# Patient Record
Sex: Female | Born: 1955 | Race: Black or African American | Hispanic: No | Marital: Single | Smoking: Former smoker
Health system: Southern US, Community
[De-identification: ages and names within clinical notes are randomized; demographics above are authoritative.]

## PROBLEM LIST (undated history)

## (undated) DIAGNOSIS — I739 Peripheral vascular disease, unspecified: Secondary | ICD-10-CM

## (undated) DIAGNOSIS — F0391 Unspecified dementia with behavioral disturbance: Secondary | ICD-10-CM

## (undated) DIAGNOSIS — E11319 Type 2 diabetes mellitus with unspecified diabetic retinopathy without macular edema: Secondary | ICD-10-CM

## (undated) DIAGNOSIS — K5900 Constipation, unspecified: Secondary | ICD-10-CM

## (undated) DIAGNOSIS — N182 Chronic kidney disease, stage 2 (mild): Secondary | ICD-10-CM

## (undated) DIAGNOSIS — N289 Disorder of kidney and ureter, unspecified: Secondary | ICD-10-CM

## (undated) DIAGNOSIS — I10 Essential (primary) hypertension: Secondary | ICD-10-CM

## (undated) DIAGNOSIS — E119 Type 2 diabetes mellitus without complications: Secondary | ICD-10-CM

## (undated) DIAGNOSIS — F03918 Unspecified dementia, unspecified severity, with other behavioral disturbance: Secondary | ICD-10-CM

## (undated) DIAGNOSIS — G119 Hereditary ataxia, unspecified: Secondary | ICD-10-CM

---

## 2013-09-23 ENCOUNTER — Inpatient Hospital Stay: Payer: Self-pay | Admitting: Internal Medicine

## 2013-09-23 LAB — CBC
HCT: 31.9 % — ABNORMAL LOW (ref 35.0–47.0)
HGB: 10.3 g/dL — ABNORMAL LOW (ref 12.0–16.0)
MCH: 27.1 pg (ref 26.0–34.0)
MCHC: 32.3 g/dL (ref 32.0–36.0)
MCV: 84 fL (ref 80–100)
PLATELETS: 436 10*3/uL (ref 150–440)
RBC: 3.8 10*6/uL (ref 3.80–5.20)
RDW: 13.9 % (ref 11.5–14.5)
WBC: 9.9 10*3/uL (ref 3.6–11.0)

## 2013-09-23 LAB — COMPREHENSIVE METABOLIC PANEL
ALT: 28 U/L (ref 12–78)
ANION GAP: 6 — AB (ref 7–16)
Albumin: 3.2 g/dL — ABNORMAL LOW (ref 3.4–5.0)
Alkaline Phosphatase: 168 U/L — ABNORMAL HIGH
BILIRUBIN TOTAL: 0.2 mg/dL (ref 0.2–1.0)
BUN: 27 mg/dL — ABNORMAL HIGH (ref 7–18)
CHLORIDE: 106 mmol/L (ref 98–107)
CO2: 28 mmol/L (ref 21–32)
Calcium, Total: 9.6 mg/dL (ref 8.5–10.1)
Creatinine: 0.87 mg/dL (ref 0.60–1.30)
Glucose: 93 mg/dL (ref 65–99)
OSMOLALITY: 284 (ref 275–301)
POTASSIUM: 4.2 mmol/L (ref 3.5–5.1)
SGOT(AST): 33 U/L (ref 15–37)
SODIUM: 140 mmol/L (ref 136–145)
Total Protein: 9 g/dL — ABNORMAL HIGH (ref 6.4–8.2)

## 2013-09-24 LAB — RAPID HIV-1/2 QL/CONFIRM: HIV-1/2,Rapid Ql: NEGATIVE

## 2013-09-24 LAB — BILIRUBIN, DIRECT

## 2013-09-25 LAB — CBC WITH DIFFERENTIAL/PLATELET
BASOS ABS: 0.1 10*3/uL (ref 0.0–0.1)
Basophil %: 0.7 %
EOS PCT: 0.2 %
Eosinophil #: 0 10*3/uL (ref 0.0–0.7)
HCT: 27.4 % — AB (ref 35.0–47.0)
HGB: 9 g/dL — ABNORMAL LOW (ref 12.0–16.0)
LYMPHS ABS: 2.7 10*3/uL (ref 1.0–3.6)
Lymphocyte %: 28.8 %
MCH: 27.3 pg (ref 26.0–34.0)
MCHC: 32.8 g/dL (ref 32.0–36.0)
MCV: 83 fL (ref 80–100)
MONO ABS: 0.5 x10 3/mm (ref 0.2–0.9)
Monocyte %: 5.6 %
NEUTROS ABS: 6 10*3/uL (ref 1.4–6.5)
Neutrophil %: 64.7 %
Platelet: 354 10*3/uL (ref 150–440)
RBC: 3.3 10*6/uL — AB (ref 3.80–5.20)
RDW: 13.6 % (ref 11.5–14.5)
WBC: 9.2 10*3/uL (ref 3.6–11.0)

## 2013-09-25 LAB — BASIC METABOLIC PANEL
ANION GAP: 6 — AB (ref 7–16)
BUN: 34 mg/dL — AB (ref 7–18)
CO2: 25 mmol/L (ref 21–32)
CREATININE: 1.14 mg/dL (ref 0.60–1.30)
Calcium, Total: 9.2 mg/dL (ref 8.5–10.1)
Chloride: 109 mmol/L — ABNORMAL HIGH (ref 98–107)
EGFR (African American): >60
GFR CALC NON AF AMER: 52 — AB
GLUCOSE: 224 mg/dL — AB (ref 65–99)
Osmolality: 294 (ref 275–301)
POTASSIUM: 4.1 mmol/L (ref 3.5–5.1)
Sodium: 140 mmol/L (ref 136–145)

## 2013-09-26 LAB — COMPREHENSIVE METABOLIC PANEL
ALT: 26 U/L (ref 12–78)
ANION GAP: 4 — AB (ref 7–16)
Albumin: 2.7 g/dL — ABNORMAL LOW (ref 3.4–5.0)
Alkaline Phosphatase: 150 U/L — ABNORMAL HIGH
BUN: 21 mg/dL — ABNORMAL HIGH (ref 7–18)
Bilirubin,Total: 0.2 mg/dL (ref 0.2–1.0)
CALCIUM: 9.6 mg/dL (ref 8.5–10.1)
CHLORIDE: 104 mmol/L (ref 98–107)
CREATININE: 0.86 mg/dL (ref 0.60–1.30)
Co2: 31 mmol/L (ref 21–32)
EGFR (Non-African Amer.): >60
GLUCOSE: 138 mg/dL — AB (ref 65–99)
OSMOLALITY: 283 (ref 275–301)
Potassium: 4.1 mmol/L (ref 3.5–5.1)
SGOT(AST): 32 U/L (ref 15–37)
Sodium: 139 mmol/L (ref 136–145)
Total Protein: 7.9 g/dL (ref 6.4–8.2)

## 2013-09-27 LAB — WOUND CULTURE

## 2013-09-28 LAB — CULTURE, BLOOD (SINGLE)

## 2013-09-28 LAB — WOUND CULTURE

## 2013-09-29 LAB — PATHOLOGY REPORT

## 2013-11-04 ENCOUNTER — Ambulatory Visit: Payer: Self-pay | Admitting: Podiatry

## 2013-11-04 LAB — CBC WITH DIFFERENTIAL/PLATELET
Basophil #: 0.1 10*3/uL (ref 0.0–0.1)
Basophil %: 0.8 %
EOS PCT: 1.8 %
Eosinophil #: 0.1 10*3/uL (ref 0.0–0.7)
HCT: 24.7 % — AB (ref 35.0–47.0)
HGB: 8.3 g/dL — AB (ref 12.0–16.0)
LYMPHS ABS: 2.6 10*3/uL (ref 1.0–3.6)
LYMPHS PCT: 34.5 %
MCH: 28 pg (ref 26.0–34.0)
MCHC: 33.5 g/dL (ref 32.0–36.0)
MCV: 84 fL (ref 80–100)
Monocyte #: 0.5 x10 3/mm (ref 0.2–0.9)
Monocyte %: 6.5 %
NEUTROS PCT: 56.4 %
Neutrophil #: 4.3 10*3/uL (ref 1.4–6.5)
PLATELETS: 333 10*3/uL (ref 150–440)
RBC: 2.96 10*6/uL — ABNORMAL LOW (ref 3.80–5.20)
RDW: 14.9 % — ABNORMAL HIGH (ref 11.5–14.5)
WBC: 7.6 10*3/uL (ref 3.6–11.0)

## 2013-11-05 ENCOUNTER — Ambulatory Visit: Payer: Self-pay | Admitting: Podiatry

## 2013-11-08 LAB — PATHOLOGY REPORT

## 2013-11-09 LAB — WOUND CULTURE

## 2014-09-17 NOTE — Consult Note (Signed)
PATIENT NAME:  Holly BrothersRESIDENT, Holly E MR#:  [id number] DATE OF BIRTH:  [birth year]  DATE OF CONSULTATION:  09/24/2013  CONSULTING PHYSICIAN:  Linus Galasodd Braylinn Gulden, DPM  REASON FOR CONSULTATION:  This is a 59 year old female recently admitted with some pain in her left great toe.  States she has had a sore that has been bleeding occasionally over the last 3 weeks.  Denies any specific injury.  Presented to the Emergency Department where she was diagnosed with osteomyelitis and admitted for IV antibiotics and treatment.   PAST MEDICAL HISTORY:  Diabetes, hypertension.   PAST SURGICAL HISTORY:  Appendectomy.   MEDICATIONS:  Metformin 1000 mg b.i.d., amlodipine 10 mg daily, pravastatin 40 mg daily.   ALLERGIES:  No known drug allergies.   FAMILY HISTORY:  Hypertension.   SOCIAL HISTORY:  She lives with her sister.  Does use occasional tobacco.  Denies any alcohol or drug use.   REVIEW OF SYSTEMS:  Denies any specific fever or weakness.  No hearing or vision changes. Some swelling in the left great toe but no current drainage.  Denies any numbness, paresthesias. No dysuria or incontinence.  No stomach pain, heartburn, or nausea.   PHYSICAL EXAMINATION: VASCULAR:  DP and PT pulses are palpable.  Capillary filling time within normal limits.  NEUROLOGICAL:  Protective threshold with monofilament wire is intact and symmetric. Proprioception is intact.  INTEGUMENT:  Skin is warm, dry, and supple.  Some mild diminished hair growth.  Some edema and erythema in the left hallux with some fluctuant but no active drainage around the IPJ.  MUSCULOSKELETAL:  Adequate range of motion.  Muscle testing is 5/5.   X-RAYS:  Multiple views of the left foot reveal cortical destruction at the base of the distal phalanx.  This is consistent with osteomyelitis.  Followup studies with MRI revealed evidence for osteomyelitis in the distal phalanx and the proximal phalanx.  Abscess is noted in the soft tissues dorsally.    IMPRESSION: 1.  Osteomyelitis, left great toe.  2.  Diabetes.   PLAN:  I discussed with the patient the need for amputation of the left great toe.  We discussed risks versus benefits.  Possible complications including not healing or continued infection requiring further amputation were also discussed.  Discussed this with her sister as well by phone.  The patient is n.p.o.  Obtained consent for amputation left great toe.  We will plan on surgery for later on this evening.   ____________________________ Linus Galasodd Ivelise Castillo, DPM tc:ja D: 09/24/2013 14:15:37 ET T: 09/24/2013 14:40:35 ET JOB#: [id number]  cc: Linus Galasodd Ching Rabideau, DPM, <Dictator> Dorrien Grunder DPM ELECTRONICALLY SIGNED 10/05/2013 15:59

## 2014-09-17 NOTE — Consult Note (Signed)
PATIENT NAME:  Holly Ford, Holly Ford MR#:  [id number] DATE OF BIRTH:  1956-02-11  DATE OF CONSULTATION:  09/24/2013  REFERRING PHYSICIAN:  Fritzi Mandes, MD CONSULTING PHYSICIAN:  Cheral Marker. Ola Spurr, MD  REASON FOR CONSULT: Left great toe osteomyelitis in a diabetic patient.   HISTORY OF PRESENT ILLNESS: This is a very pleasant 59 year old female with a history of hypertension and diabetes and hyperlipidemia. She is visiting her sister from [place] and has been up here about 3 weeks. She began developing pain and bleeding from her left great toe but denies any recent injury to it or having necrotic ulcer there. The pain and bleeding progressed, and she came to the Emergency Room where she was found to have evidence of an abscess and osteomyelitis on x-ray and MRI. She is going for surgery later today.   PAST MEDICAL HISTORY:  1. Diabetes.  2. Hypertension.  3. Hyperlipidemia.   PAST SURGICAL HISTORY: Appendectomy.   ALLERGIES: No known drug allergies.   OUTPATIENT MEDICATIONS: Metformin, amlodipine and pravastatin.   ANTIBIOTICS SINCE ADMISSION: Include Zosyn.   REVIEW OF SYSTEMS: Eleven systems are negative except for as per HPI. Of note, however, the patient says she has lost a lot of weight recently. She has not been having any fevers or chills.   PHYSICAL EXAM:  VITAL SIGNS: Temperature 98.3, pulse 75, blood pressure 121/68, respirations 18, sat 98% on room air.  GENERAL: She is quite thin.  HEENT: Pupils are equal, round and reactive to light and accommodation. Extraocular movements are intact. Sclerae are anicteric. Oropharynx is clear.  NECK: Supple.  HEART: Regular.  LUNGS: Clear to auscultation bilaterally.  ABDOMEN: Soft, nontender, nondistended. No hepatosplenomegaly.   EXTREMITIES: She has no edema. Her left great toe is quite swollen and tender. There is some evidence of purulence as well.  NEUROLOGICAL: She is alert, interactive but has relatively poor insight.    IMAGING: MRI of her left foot showed osteomyelitis of the terminal and proximal phalanx of the great toe with probable septic arthritis of the great toe  joint, as well a small abscess 1 cm.   LABORATORY: White blood count on admission was 9.9, hemoglobin 10.3, platelets 436. CRP was 68.6. Renal function was normal at 0.87. Total protein is quite high at 9.0 but albumin a little low at 3.2. Alk phos is slightly elevated at 168. Other LFTs are normal. Blood cultures x 2 are negative. Wound cultures have colonies that are too small to read. Gram stain was negative.   IMPRESSION: A 59 year old with a left great toe osteomyelitis and abscess. She also interestingly seems to have lost a significant amount of weight over the last several months per the patient. She has elevated total protein but low albumin. She has a mild anemia as well.   RECOMMENDATIONS:  1. Will check an HIV test, and I discussed this with the patient. She agrees.  2. Agree with surgery by Dr. Cleda Mccreedy and cultures. Further antibiotics will be pending. Depending on how much residual infection is remaining, she may be able to be treated with oral antibiotics so I would hold on placing a PICC at this time.  3. Given her weight loss, I would suggest a chest x-ray.  4. Given her elevated protein level, I would suggest repeating that today. If it is still elevated, she may benefit from evaluation with an SPEP, UPEP looking for any monoclonal gammopathy.   Thank you for the consult. I will be glad to follow with you.  I will see the patient again on Monday. Please call with questions.   ____________________________ Cheral Marker. Ola Spurr, MD dpf:gb D: 09/24/2013 73:66:81 ET T: 09/24/2013 16:19:18 ET JOB#: [id number]  cc: Cheral Marker. Ola Spurr, MD, <Dictator> Sarah Zerby Ola Spurr MD ELECTRONICALLY SIGNED 09/26/2013 21:13

## 2014-09-17 NOTE — Op Note (Signed)
PATIENT NAME:  Holly BrothersRESIDENT, Holly Ford MR#:  [id number] DATE OF BIRTH:  May 02, 1956  DATE OF PROCEDURE:  09/24/2013  SURGEON:  Ricci Barkerodd W. Carrol Bondar, DPM  PREOPERATIVE DIAGNOSIS:  Osteomyelitis, left great toe.   POSTOPERATIVE DIAGNOSIS:  Osteomyelitis, left great toe.   PROCEDURE:  Amputation, left great toe.   ANESTHESIA:  Local MAC.   HEMOSTASIS:  Esmarch bandage, left ankle.   ESTIMATED BLOOD LOSS:  Minimal.   PATHOLOGY:  Left great toe.   CULTURES:  Bone cultures, distal phalanx, left great toe.   DRAINS:  None.   COMPLICATIONS:  None apparent.   OPERATIVE INDICATIONS:  This is a 59 year old female with recent history of pain in her left great toe. She is diabetic. Upon evaluation in the ER, osteomyelitis was noted in the left great toe, and decision made for hospitalization and amputation.   OPERATIVE PROCEDURE:  The patient was taken to the operating room and placed on the table in the supine position. Following satisfactory sedation, the left foot was anesthetized with 10 mL of 0.5% Sensorcaine plain around the left first metatarsal. The foot was then prepped and draped in the usual sterile fashion. The foot was exsanguinated with an Esmarch, and this was left around the ankle to act as a tourniquet.   Attention was then directed to the distal aspect of the left great toe where a racquet-type incision was made around the base of the great toe from the interspace extending medially to the joint. The incision was deepened sharply down to the level of bone, and the soft tissues were freed from the bone covering. There was noted to be significant purulence in the distal phalanx area. Next, using a pneumatic saw, the base of the proximal phalanx was incised, and the distal aspect of the toe was removed with care taken to leave the joint intact. The wound was flushed with copious amounts of sterile saline. The incision was then closed using 4-0 nylon simple interrupted and vertical mattress  sutures. Xeroform and a sterile bandage applied. The tourniquet was released. A Kerlix and an Ace wrap were applied. The patient tolerated the procedure and anesthesia well, and was transported to the PACU with vital signs stable and in good condition.    ____________________________ Linus Galasodd Dalal Livengood, DPM tc:ms D: 09/24/2013 21:17:33 ET T: 09/24/2013 21:54:46 ET JOB#: [id number]  cc: Linus Galasodd Krista Som, DPM, <Dictator> Kaelyn Nauta DPM ELECTRONICALLY SIGNED 10/05/2013 15:59

## 2014-09-17 NOTE — H&P (Signed)
PATIENT NAME:  Holly Ford, Holly Ford MR#:  [id number] DATE OF BIRTH:  08-06-1955  DATE OF ADMISSION:  09/23/2013  PRESENTING COMPLAINT: Pain on the left great toe.  History is obtained from the patient's sister, Holly Ford. Her phone number is [phone] and patient.   HISTORY OF PRESENT ILLNESS: Holly Ford is a 59 year old African American female who has history of hypertension, diabetes, comes to the Emergency Room with complaints of left great toe pain and bleeding from the toe for the last couple of days. The patient states she has been having some progressive increasing pain on this left great toe for several weeks and has recently started bleeding significantly from the left great toe. She has been complaining of pain and is unable to put any weight on it. In the Emergency Room, she was evaluated and found to have left great toe osteomyelitis per x-ray. She received a dose of IV Zosyn. Cultures have been sent and hospitalist was called for further evaluation and management.   PAST MEDICAL HISTORY: 1.  Hypertension.  2.  Diabetes.   PAST SURGICAL HISTORY: Appendectomy.   ALLERGIES: No known drug allergies.   MEDICATIONS: 1.  Metformin 1000 mg b.i.d.  2.  Amlodipine 10 mg daily.  3.  Pravastatin 40 mg daily.   SOCIAL HISTORY: She lives with her sister. She is originally from [place], currently lives with her sister, Holly Ford. She smokes once in a while, denies any alcohol or any other drug use.   FAMILY HISTORY: Positive for hypertension.  REVIEW OF SYSTEMS:  CONSTITUTIONAL: No fever, fatigue, weakness.  EYES: No blurred or double vision, glaucoma or cataracts.  EARS, NOSE, THROAT: No tinnitus, ear pain, hearing loss or postnasal drip.  RESPIRATORY: No cough or wheeze, hemoptysis, dyspnea or asthma.  CARDIOVASCULAR: No chest pain, orthopnea, edema or dyspnea on exertion.  GASTROINTESTINAL: No nausea, vomiting, diarrhea, abdominal pain, hematemesis or GERD.  GENITOURINARY: No  dysuria, hematuria or frequency.  ENDOCRINE: No polyuria, nocturia or thyroid problems.  HEMATOLOGY: No anemia, easy bruising or bleeding.   SKIN: No acne, rash or lesions.  MUSCULOSKELETAL: Positive for arthritis. Positive for swelling and pain in the left great toe.  NEUROLOGIC: No CVA, TIA, vertigo or dysarthria.  PSYCHIATRIC: No anxiety, depression or bipolar disorder.  All other systems reviewed are negative.  PHYSICAL EXAMINATION: GENERAL:  The patient is awake, alert, oriented x 3, not in acute distress, afebrile.  VITAL SIGNS:  Pulse is 81. Blood pressure is 120/55. Sats are 98% on room air.  HEENT: Atraumatic, normocephalic. PERRLA. EOM intact. Oral mucosa is moist.  NECK: Supple. No JVD. No carotid bruits.  RESPIRATORY: Clear to auscultation bilaterally. No rales, rhonchi, respiratory distress or labored breathing.  CARDIOVASCULAR: Both heart sounds are rate, rhythm, regular. PMI not lateralized. CHEST:  Nontender.  EXTREMITIES: Good pedal pulses. Good femoral pulses. No lower extremity edema.  Lower extremities:  The patient has left great toe onychomycosis. She had some discharge with some bright red blood oozing slowing with some dry scabs around the left great toe. Due to her skin color, unable to see if patient has underlying cellulitis or not.  ABDOMEN: Soft, benign, nontender. No organomegaly. Positive bowel sounds.  NEUROLOGIC: Grossly intact cranial nerves II through XII. No motor or sensory deficit.  PSYCHIATRIC: The patient is awake, alert, oriented x 3.  CBC within normal limits, except H and H of 10.3 and 31.9. Glucose is 93. BUN is 27. Creatinine is 0.87. Sodium is 140. Potassium is 4.2. Chloride  is 106. Bicarb is 28. Calcium is 9.2.  Bilirubin total is 0.2. Left great toe shows focal bone resorption along the lateral base of the first distal phalanx with cortical irregularity concerning for osteomyelitis.   ASSESSMENT:  A 59 year old [place] female with  history of hypertension, diabetes, comes in with:  1.  Left great toe osteomyelitis. The patient has chronic onychomycosis and is oozing.  Her left great toe appears swollen, tender and is oozing some sanguinous discharge. X-ray of the foot shows possible osteomyelitis. We will admit the patient to the medical floor, continue her IV Zosyn, follow blood cultures and wound cultures. Podiatry consultation has been placed. We will order MRI of the foot.  Further management for podiatry.  2.  Type 2 diabetes. I will place patient on sliding scale insulin. Her sugars are stable. We will hold off on metformin given radiological studies in progress. 3.  Hypertension. Continue amlodipine.  4.  Hyperlipidemia. Continue pravastatin.  5.  Deep vein thrombosis prophylaxis, subcutaneous heparin. 6.  Above was discussed with the patient, the patient's sister, Holly Ford. Further workup according to patient's clinical course.   TIME SPENT: 50 minutes.    ____________________________ Holly HailSona A. Allena KatzPatel, MD sap:ce D: 09/23/2013 17:25:47 ET T: 09/23/2013 18:47:17 ET JOB#: [id number]  cc: Holly Haller A. Allena KatzPatel, MD, <Dictator> Holly OraSONA A Emmaly Leech MD ELECTRONICALLY SIGNED 09/24/2013 12:51

## 2014-09-17 NOTE — Op Note (Signed)
PATIENT NAME:  Holly BrothersRESIDENT, Holly Ford MR#:  [id number] DATE OF BIRTH:  1955-11-15  DATE OF PROCEDURE:  11/05/2013  SURGEON: Ricci Barkerodd W. Jeancarlo Leffler, DPM  PREOPERATIVE DIAGNOSIS: Osteomyelitis, left great toe joint.   POSTOPERATIVE DIAGNOSIS: Osteomyelitis, left great toe joint.   PROCEDURE: Debridement, bone and joint, left first metatarsophalangeal joint.   ANESTHESIA: Local MAC.   HEMOSTASIS: None.   ESTIMATED BLOOD LOSS: 50 mL.   PATHOLOGY: Bone and joint, left great toe joint.   MATERIALS:  Stimulan Rapid Cure beads with vancomycin.   DRAINS: None.   COMPLICATIONS: None apparent.   OPERATIVE INDICATIONS: This is a 59 year old female who recently underwent amputation of her left great toe secondary to osteomyelitis. She had a nonhealing portion of the incision which progressed down to the level of exposed base of the proximal phalanx, and decision was made for re-debridement of the foot.   OPERATIVE PROCEDURE: The patient was taken to the operating room and placed on the table in the supine position. Following satisfactory sedation, the left foot was anesthetized with 10 mL of 0.5% Sensorcaine plain around the first metatarsal. The foot was then prepped and draped in the usual sterile fashion. Attention was directed towards the medial aspect of the left foot, where an open wound was noted over the amputation site at the great toe. An incision was then made proximally approximately 3 cm and 1 cm distally on both sides of the ulcerative area. The incision was carried sharply down to the level of the bone, where the soft tissue was freed off of the joint and first metatarsal head. Using a pneumatic saw, the head of the first metatarsal was incised and the metatarsal head and remaining base of the proximal phalanx and joint were then removed in toto. Next, excisional debridement was performed with a VersaJet debrider on a setting of 6 of some of the devitalized tissue from around the open portion of  the wound and ulceration. This was excised down to the level of bone using the VersaJet. The wound was then flushed with copious amounts of sterile saline. Intraoperative FluoroScan views were taken to show good reduction of the head of the first metatarsal. The wound was then partially closed with 4-0 nylon simple interrupted sutures, and the wound was then packed with Stimulan Rapid Cure beads impregnated with vancomycin, and then the remainder of the incision was closed. Xeroform, 4 x 4's and a sterile bandage were applied. The patient tolerated the procedure and anesthesia well and was transported to the PACU with vital signs stable and in good condition.   ____________________________ Linus Galasodd Daleyza Gadomski, DPM tc:lb D: 11/05/2013 09:00:35 ET T: 11/05/2013 09:30:39 ET JOB#: [id number]  cc: Linus Galasodd Noor Vidales, DPM, <Dictator> Rendi Mapel DPM ELECTRONICALLY SIGNED 11/19/2013 13:13

## 2014-09-17 NOTE — Discharge Summary (Signed)
PATIENT NAME:  Holly BrothersRESIDENT, Katheleen E MR#:  [id number] DATE OF BIRTH:  [birth year]  DATE OF ADMISSION:  09/23/2013 DATE OF DISCHARGE:  09/27/2013  ADMISSION DIAGNOSIS: Osteomyelitis of the great toe, left foot.  DISCHARGE DIAGNOSES: 1. Osteomyelitis status post amputation of the great toe.  2. Diabetes.  3. Hypertension.  4. Hyperlipidemia.   CONSULTATIONS:  1. Dr. Sampson GoonFitzgerald. 2. Dr. Alberteen Spindleline.   PROCEDURES: On 09/24/2013, patient underwent amputation of the left great toe for osteomyelitis.   LABORATORY DATA: Positive for Staph lugdunensis.  HOSPITAL COURSE: This is a very pleasant 59 year old female who presented with osteomyelitis of the left great toe. For further details, please refer to the H and P.  1. Osteomyelitis. The patient was admitted to the hospitalist service. She was placed on broad spectrum antibiotics. Dr. Alberteen Spindleline and Dr. Sampson GoonFitzgerald were consulted regarding her osteomyelitis. The patient underwent an amputation of the left great toe on May 1. Her culture was growing Staph lugdunensis. Dr. Sampson GoonFitzgerald recommended Augmentin 875 b.i.d. for at least 2 weeks. The patient does not have insurance and so was unable to go to rehab as recommended by physical therapy or home with home health care, but she was referred to outpatient physical therapy. She will follow up with Dr. Alberteen Spindleline in 1 week.  2. Diabetes. The patient was on sliding scale insulin here. May resume her metformin at discharge.  3. Hyperlipidemia. The patient will continue pravastatin. 4. Hypertension. The patient is to continued on Norvasc.   DISCHARGE MEDICATIONS:  1. Acetaminophen hydrocodone 325/5 q. 4 hours p.r.n. pain. 2. Augmentin 875 mg b.i.d. x 14 days.  3. Norvasc 10 mg daily.  4. Metformin 1000 b.i.d.  5. Pravastatin 40 mg daily.   DISCHARGE DIET: Low-sodium, ADA diet.   DISCHARGE ACTIVITY: As tolerated.   DISCHARGE REFERRAL: Outpatient physical therapy.   DISCHARGE FOLLOWUP: The patient will follow up  with Dr. Alberteen Spindleline in 1 week.  TIME SPENT: 35 minutes.   The patient is medically stable for discharge.    ____________________________ Maaliyah Adolph P. Juliene PinaMody, MD spm:lt D: 09/27/2013 14:32:05 ET T: 09/28/2013 00:41:24 ET JOB#: [id number]  cc: Lanya Bucks P. Juliene PinaMody, MD, <Dictator> Linus Galasodd Cline, DPM Stann Mainlandavid P. Sampson GoonFitzgerald, MD  Patricia PesaSITAL P Larrie Lucia MD ELECTRONICALLY SIGNED 09/28/2013 13:17

## 2015-02-24 IMAGING — CR LEFT GREAT TOE
1 series · 3 of 3 positions shown · non-contrast
Comparison: None.

CLINICAL DATA: Left great toe pain and infection

EXAM:
LEFT GREAT TOE

[Series 1: ap · 0.17mm/px · 3 of 3 slices shown]
[im 1/3]
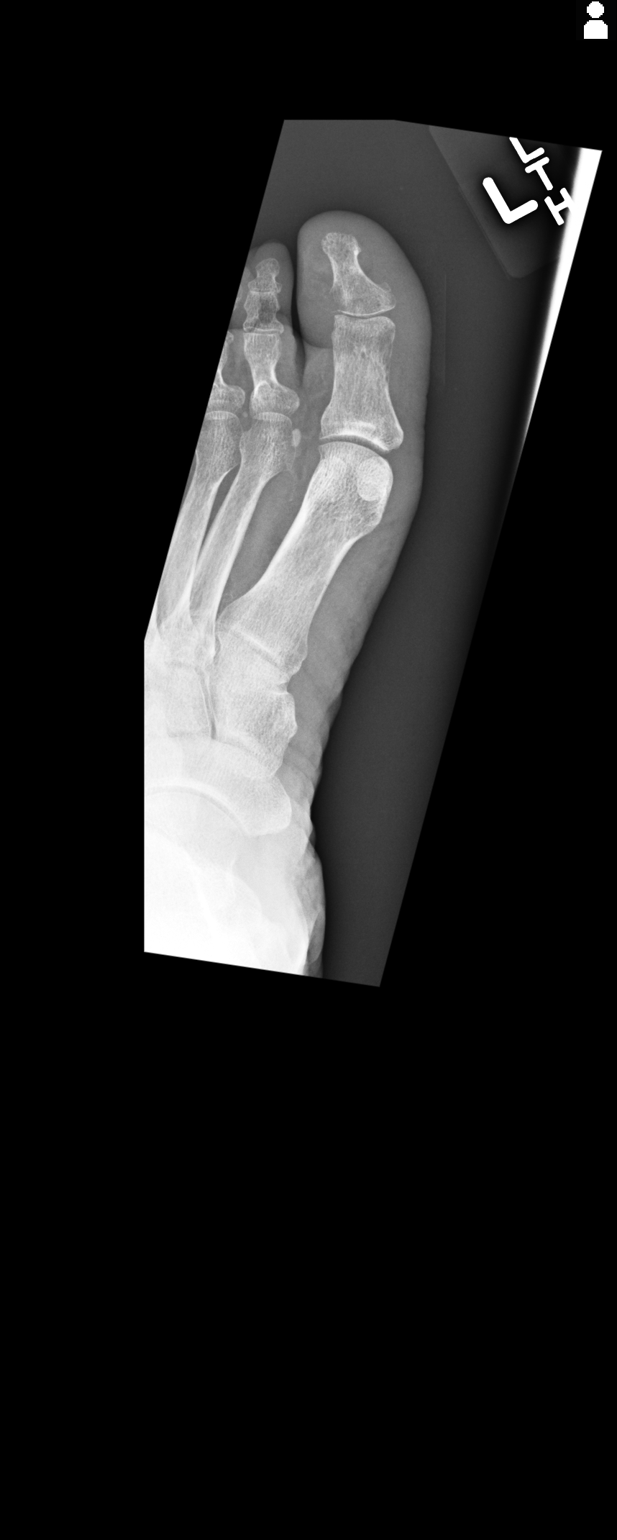
[im 2/3]
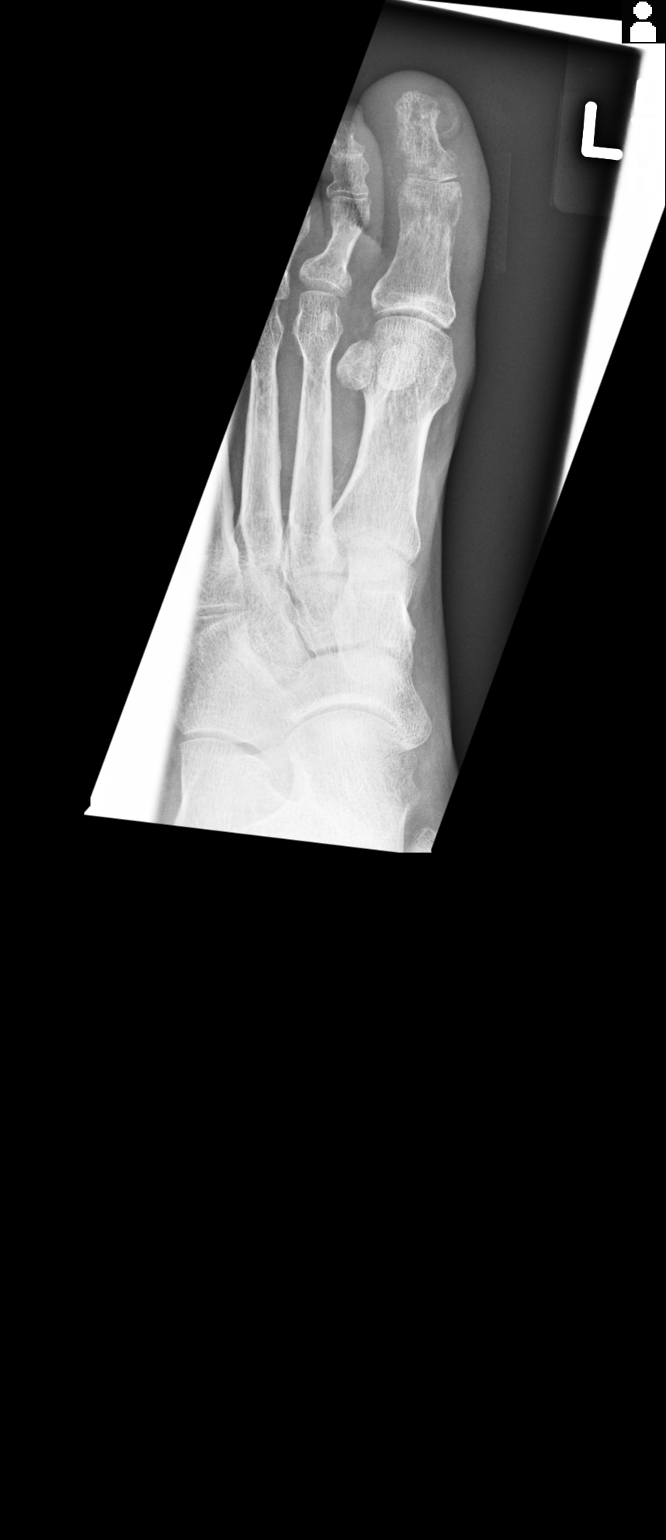
[im 3/3]
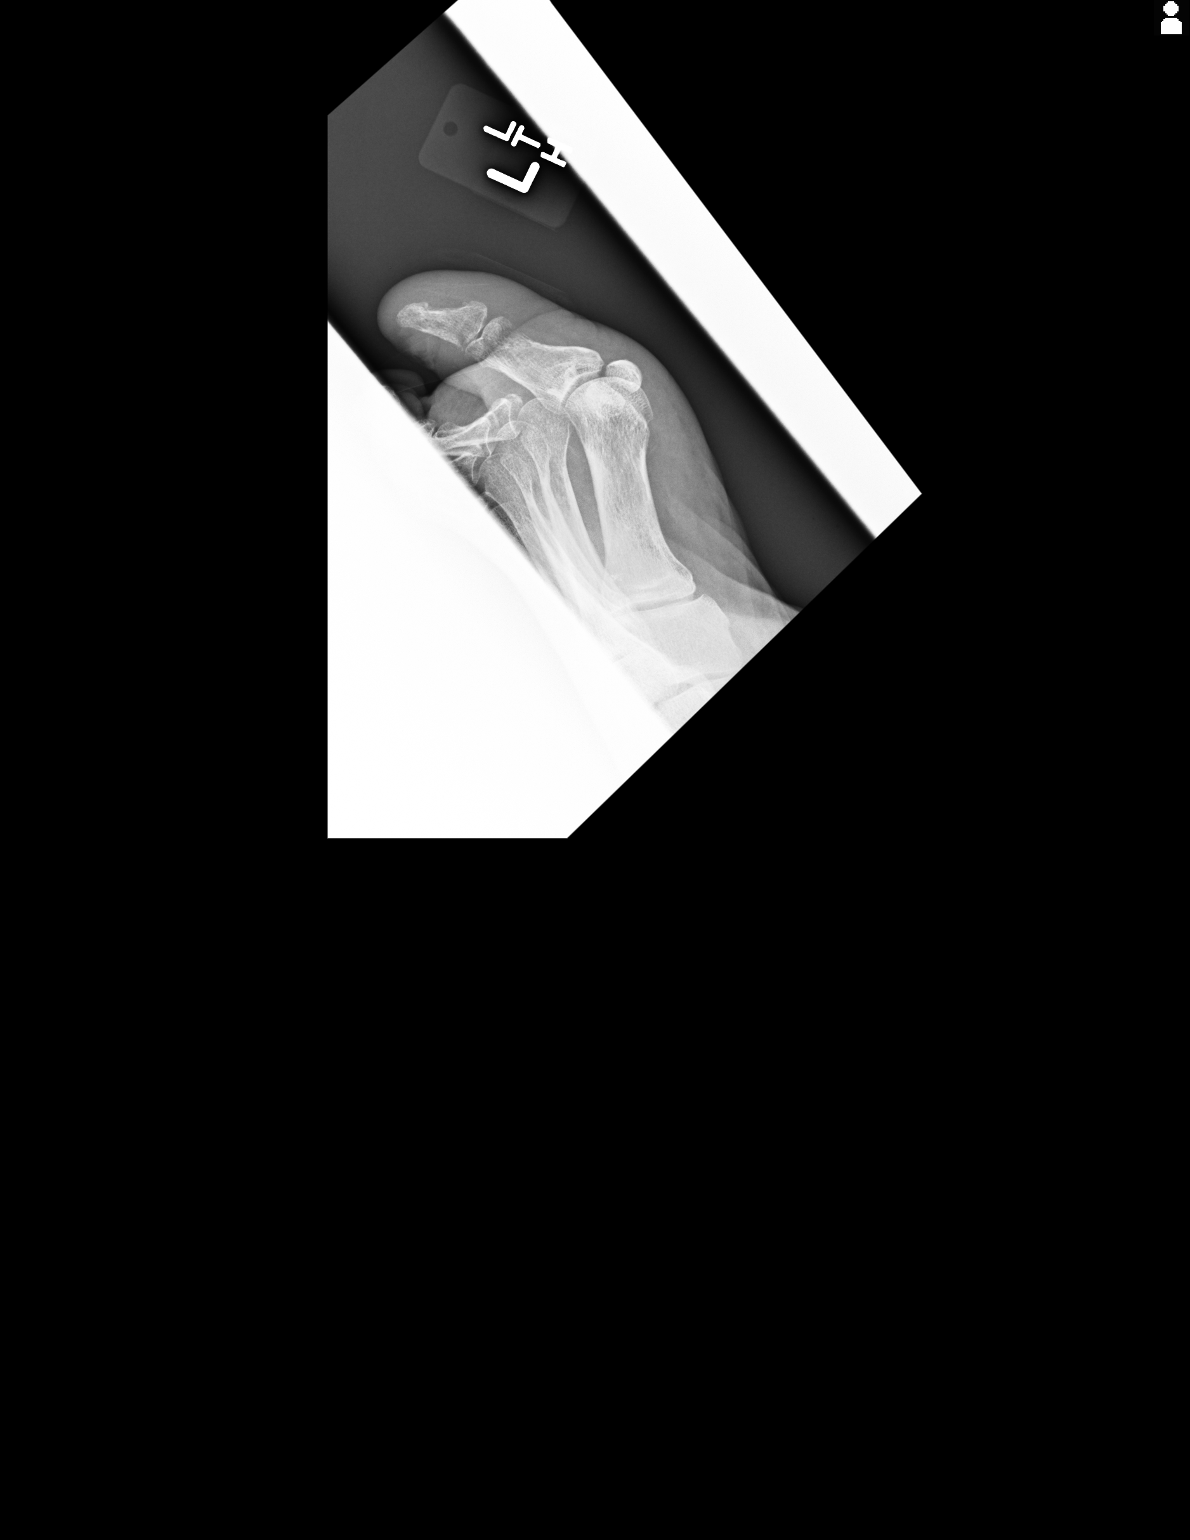

[3 of 3 positions shown; findings below may reference images not displayed]

FINDINGS: There is no acute fracture or dislocation. There is generalized
osteopenia. There is focal bone resorption along the lateral base of
the first distal phalanx with cortical irregularity concerning for
osteomyelitis. There is no subcutaneous emphysema. There is no
radiopaque foreign body.
IMPRESSION: There is focal bone resorption along the lateral base of the first
distal phalanx with cortical irregularity concerning for
osteomyelitis.

## 2015-09-20 ENCOUNTER — Encounter: Payer: Self-pay | Admitting: Emergency Medicine

## 2015-09-20 ENCOUNTER — Emergency Department
Admission: EM | Admit: 2015-09-20 | Discharge: 2015-09-21 | Disposition: A | Discharge status: Home / Self Care | Payer: Medicare (Managed Care) | Attending: Emergency Medicine | Admitting: Emergency Medicine

## 2015-09-20 DIAGNOSIS — Z7982 Long term (current) use of aspirin: Secondary | ICD-10-CM | POA: Diagnosis not present

## 2015-09-20 DIAGNOSIS — E1122 Type 2 diabetes mellitus with diabetic chronic kidney disease: Secondary | ICD-10-CM | POA: Insufficient documentation

## 2015-09-20 DIAGNOSIS — Z794 Long term (current) use of insulin: Secondary | ICD-10-CM | POA: Insufficient documentation

## 2015-09-20 DIAGNOSIS — E1151 Type 2 diabetes mellitus with diabetic peripheral angiopathy without gangrene: Secondary | ICD-10-CM | POA: Insufficient documentation

## 2015-09-20 DIAGNOSIS — Z79899 Other long term (current) drug therapy: Secondary | ICD-10-CM | POA: Insufficient documentation

## 2015-09-20 DIAGNOSIS — E1165 Type 2 diabetes mellitus with hyperglycemia: Secondary | ICD-10-CM | POA: Insufficient documentation

## 2015-09-20 DIAGNOSIS — E11319 Type 2 diabetes mellitus with unspecified diabetic retinopathy without macular edema: Secondary | ICD-10-CM | POA: Diagnosis not present

## 2015-09-20 DIAGNOSIS — I129 Hypertensive chronic kidney disease with stage 1 through stage 4 chronic kidney disease, or unspecified chronic kidney disease: Secondary | ICD-10-CM | POA: Diagnosis not present

## 2015-09-20 DIAGNOSIS — Z87891 Personal history of nicotine dependence: Secondary | ICD-10-CM | POA: Diagnosis not present

## 2015-09-20 DIAGNOSIS — N182 Chronic kidney disease, stage 2 (mild): Secondary | ICD-10-CM | POA: Diagnosis not present

## 2015-09-20 HISTORY — DX: Disorder of kidney and ureter, unspecified: N28.9

## 2015-09-20 HISTORY — DX: Type 2 diabetes mellitus without complications: E11.9

## 2015-09-20 HISTORY — DX: Hereditary ataxia, unspecified: G11.9

## 2015-09-20 HISTORY — DX: Constipation, unspecified: K59.00

## 2015-09-20 HISTORY — DX: Essential (primary) hypertension: I10

## 2015-09-20 HISTORY — DX: Type 2 diabetes mellitus with unspecified diabetic retinopathy without macular edema: E11.319

## 2015-09-20 HISTORY — DX: Unspecified dementia, unspecified severity, with other behavioral disturbance: F03.918

## 2015-09-20 HISTORY — DX: Unspecified dementia with behavioral disturbance: F03.91

## 2015-09-20 HISTORY — DX: Peripheral vascular disease, unspecified: I73.9

## 2015-09-20 HISTORY — DX: Chronic kidney disease, stage 2 (mild): N18.2

## 2015-09-20 LAB — URINALYSIS COMPLETE WITH MICROSCOPIC (ARMC ONLY)
BILIRUBIN URINE: NEGATIVE
HGB URINE DIPSTICK: NEGATIVE
Ketones, ur: NEGATIVE mg/dL
Leukocytes, UA: NEGATIVE
NITRITE: NEGATIVE
Protein, ur: 100 mg/dL — AB
SPECIFIC GRAVITY, URINE: 1.013 (ref 1.005–1.030)
pH: 6 (ref 5.0–8.0)

## 2015-09-20 LAB — GLUCOSE, CAPILLARY
Glucose-Capillary: 306 mg/dL — ABNORMAL HIGH (ref 65–99)
Glucose-Capillary: 328 mg/dL — ABNORMAL HIGH (ref 65–99)

## 2015-09-20 LAB — BASIC METABOLIC PANEL
ANION GAP: 8 (ref 5–15)
BUN: 20 mg/dL (ref 6–20)
CALCIUM: 9.2 mg/dL (ref 8.9–10.3)
CO2: 27 mmol/L (ref 22–32)
Chloride: 102 mmol/L (ref 101–111)
Creatinine, Ser: 1.03 mg/dL — ABNORMAL HIGH (ref 0.44–1.00)
GFR calc Af Amer: >60 mL/min (ref >60)
GFR, EST NON AFRICAN AMERICAN: 57 mL/min — AB (ref >60)
GLUCOSE: 376 mg/dL — AB (ref 65–99)
Potassium: 3.5 mmol/L (ref 3.5–5.1)
Sodium: 137 mmol/L (ref 135–145)

## 2015-09-20 LAB — CBC
HCT: 33.2 % — ABNORMAL LOW (ref 35.0–47.0)
Hemoglobin: 11.3 g/dL — ABNORMAL LOW (ref 12.0–16.0)
MCH: 28.3 pg (ref 26.0–34.0)
MCHC: 34.1 g/dL (ref 32.0–36.0)
MCV: 83 fL (ref 80.0–100.0)
PLATELETS: 320 10*3/uL (ref 150–440)
RBC: 4.01 MIL/uL (ref 3.80–5.20)
RDW: 13.8 % (ref 11.5–14.5)
WBC: 8.4 10*3/uL (ref 3.6–11.0)

## 2015-09-20 MED ORDER — SODIUM CHLORIDE 0.9 % IV BOLUS (SEPSIS)
1000.0000 mL | Freq: Once | INTRAVENOUS | Status: AC
  Administered 2015-09-20: 1000 mL via INTRAVENOUS

## 2015-09-20 NOTE — ED Notes (Addendum)
[facility], with c/o hyperglycemia. Per EMS staff checked pt blood sugar at 8 pm tonight, 446, administered 30 units of Lantus and rechecked blood sugar at 930 pm, 432. Per EMS patient glucose reading range 250-300s on average. Patient denies dizziness, weakness, or vision problems. Pt reports eating "a chocolate chip cookie" tonight. Pt alert and oriented to place and situation, per [facility] patient is at baseline. Skin warm and dry, respirations even and unlabored, call bell within reach.

## 2015-09-21 LAB — GLUCOSE, CAPILLARY
Glucose-Capillary: 277 mg/dL — ABNORMAL HIGH (ref 65–99)
Glucose-Capillary: 307 mg/dL — ABNORMAL HIGH (ref 65–99)

## 2015-09-21 MED ORDER — SODIUM CHLORIDE 0.9 % IV BOLUS (SEPSIS)
1000.0000 mL | Freq: Once | INTRAVENOUS | Status: AC
  Administered 2015-09-21: 1000 mL via INTRAVENOUS

## 2015-09-21 NOTE — ED Provider Notes (Signed)
[facility] Emergency Department Provider Note  ____________________________________________  Time seen: 11:10 PM  I have reviewed the triage vital signs and the nursing notes.   HISTORY  Chief Complaint Hyperglycemia     HPI [facility] is a 60 y.o. female history of dementia and diabetes renal disorder presents [facility]. Per report patient's glucose was 446 when checked at 8 PM. Nursing staff at the facility administered 30 units of Lantus and rechecked the sugar at 9:30 PM at which point it was 432. Patient was then transported to the emergency department. Patient states that she ate a chocolate chip cookie earlier in the evening. Patient has no complaints at this time. Patient's glucose 306 on arrival to the emergency department.     Past Medical History  Diagnosis Date  . Hypertension   . Diabetes mellitus without complication (HCC)   . Dementia with behavioral disturbance   . Diabetic retinopathy (HCC)   . Renal disorder   . Chronic kidney disease (CKD), stage II (mild)   . Peripheral vascular disease (HCC)   . Constipation   . Cerebellar ataxia (HCC)     There are no active problems to display for this patient.   History reviewed. No pertinent past surgical history.  Current Outpatient Rx  Name  Route  Sig  Dispense  Refill  . aspirin 81 MG tablet   Oral   Take 81 mg by mouth at bedtime. Take with a snake         . atorvastatin (LIPITOR) 40 MG tablet   Oral   Take 40 mg by mouth at bedtime.         . cloNIDine (CATAPRES - DOSED IN MG/24 HR) 0.1 mg/24hr patch   Transdermal   Place 0.1 mg onto the skin once a week. Apply one patch (0.1mg ) weekly on Friday for Blood Pressure.         . clotrimazole (LOTRIMIN) 1 % cream   Topical   Apply 1 application topically 2 (two) times daily. Apply to feet for fungal infection         . insulin glargine (LANTUS) 100 UNIT/ML  injection   Subcutaneous   Inject 30 Units into the skin at bedtime.         Bertram Gala. Polyethyl Glycol-Propyl Glycol (SYSTANE) 0.4-0.3 % SOLN   Ophthalmic   Apply 1 drop to eye every 4 (four) hours as needed (For dryness).         Marland Kitchen. senna-docusate (SENOKOT-S) 8.6-50 MG tablet   Oral   Take 1 tablet by mouth daily.           Allergies No known drug allergies No family history on file.  Social History Social History  Substance Use Topics  . Smoking status: Former Games developermoker  . Smokeless tobacco: None  . Alcohol Use: No    Review of Systems  Constitutional: Negative for fever. Eyes: Negative for visual changes. ENT: Negative for sore throat. Cardiovascular: Negative for chest pain. Respiratory: Negative for shortness of breath. Gastrointestinal: Negative for abdominal pain, vomiting and diarrhea. Genitourinary: Negative for dysuria. Musculoskeletal: Negative for back pain. Skin: Negative for rash. Neurological: Negative for headaches, focal weakness or numbness.   10-point ROS otherwise negative.  ____________________________________________   PHYSICAL EXAM:  VITAL SIGNS: ED Triage Vitals  Enc Vitals Group     BP 09/20/15 2226 187/76 mmHg     Pulse Rate 09/20/15 2226 68     Resp 09/20/15  2226 16     Temp 09/20/15 2226 98.6 F (37 C)     Temp Source 09/20/15 2226 Oral     SpO2 09/20/15 2221 98 %     Weight 09/20/15 2226 144 lb (65.318 kg)     Height 09/20/15 2226  (1.6 m)     Head Cir --      Peak Flow --      Pain Score 09/20/15 2226 0     Pain Loc --      Pain Edu? --      Excl. in GC? --      Constitutional: Alert and oriented. Well appearing and in no distress. Eyes: Conjunctivae are normal. PERRL. Normal extraocular movements. ENT   Head: Normocephalic and atraumatic.   Nose: No congestion/rhinnorhea.   Mouth/Throat: Mucous membranes are moist.   Neck: No stridor. Hematological/Lymphatic/Immunilogical: No cervical  lymphadenopathy. Cardiovascular: Normal rate, regular rhythm. Normal and symmetric distal pulses are present in all extremities. No murmurs, rubs, or gallops. Respiratory: Normal respiratory effort without tachypnea nor retractions. Breath sounds are clear and equal bilaterally. No wheezes/rales/rhonchi. Gastrointestinal: Soft and nontender. No distention. There is no CVA tenderness. Genitourinary: deferred Musculoskeletal: Nontender with normal range of motion in all extremities. No joint effusions.  No lower extremity tenderness nor edema. Neurologic:  Normal speech and language. No gross focal neurologic deficits are appreciated. Speech is normal.  Skin:  Skin is warm, dry and intact. No rash noted. Psychiatric: Mood and affect are normal. Speech and behavior are normal. Patient exhibits appropriate insight and judgment.  ____________________________________________    LABS (pertinent positives/negatives)  Labs Reviewed  GLUCOSE, CAPILLARY - Abnormal; Notable for the following:    Glucose-Capillary 306 (*)    All other components within normal limits  BASIC METABOLIC PANEL - Abnormal; Notable for the following:    Glucose, Bld 376 (*)    Creatinine, Ser 1.03 (*)    GFR calc non Af Amer 57 (*)    All other components within normal limits  CBC - Abnormal; Notable for the following:    Hemoglobin 11.3 (*)    HCT 33.2 (*)    All other components within normal limits  URINALYSIS COMPLETEWITH MICROSCOPIC ([facility] ONLY) - Abnormal; Notable for the following:    Color, Urine YELLOW (*)    APPearance CLEAR (*)    Glucose, UA >500 (*)    Protein, ur 100 (*)    Bacteria, UA RARE (*)    Squamous Epithelial / LPF 0-5 (*)    All other components within normal limits  GLUCOSE, CAPILLARY - Abnormal; Notable for the following:    Glucose-Capillary 328 (*)    All other components within normal limits  GLUCOSE, CAPILLARY - Abnormal; Notable for the following:    Glucose-Capillary 307 (*)     All other components within normal limits  GLUCOSE, CAPILLARY - Abnormal; Notable for the following:    Glucose-Capillary 277 (*)    All other components within normal limits  CBG MONITORING, ED       INITIAL IMPRESSION / ASSESSMENT AND PLAN / ED COURSE  Pertinent labs & imaging results that were available during my care of the patient were reviewed by me and considered in my medical decision making (see chart for details).  Patient received 2 L IV normal saline with resultant glucose 277  ____________________________________________   FINAL CLINICAL IMPRESSION(S) / ED DIAGNOSES  Final diagnoses:  Type 2 diabetes mellitus with hyperglycemia, with long-term current use of insulin (  HCC)      Darci Current, MD 09/21/15 0230

## 2015-09-21 NOTE — ED Notes (Signed)

## 2015-09-21 NOTE — Discharge Instructions (Signed)
Hyperglycemia °Hyperglycemia occurs when the glucose (sugar) in your blood is too high. Hyperglycemia can happen for many reasons, but it most often happens to people who do not know they have diabetes or are not managing their diabetes properly.  °CAUSES  °Whether you have diabetes or not, there are other causes of hyperglycemia. Hyperglycemia can occur when you have diabetes, but it can also occur in other situations that you might not be as aware of, such as: °Diabetes °· If you have diabetes and are having problems controlling your blood glucose, hyperglycemia could occur because of some of the following reasons: °¨ Not following your meal plan. °¨ Not taking your diabetes medications or not taking it properly. °¨ Exercising less or doing less activity than you normally do. °¨ Being sick. °Pre-diabetes °· This cannot be ignored. Before people develop Type 2 diabetes, they almost always have "pre-diabetes." This is when your blood glucose levels are higher than normal, but not yet high enough to be diagnosed as diabetes. Research has shown that some long-term damage to the body, especially the heart and circulatory system, may already be occurring during pre-diabetes. If you take action to manage your blood glucose when you have pre-diabetes, you may delay or prevent Type 2 diabetes from developing. °Stress °· If you have diabetes, you may be "diet" controlled or on oral medications or insulin to control your diabetes. However, you may find that your blood glucose is higher than usual in the hospital whether you have diabetes or not. This is often referred to as "stress hyperglycemia." Stress can elevate your blood glucose. This happens because of hormones put out by the body during times of stress. If stress has been the cause of your high blood glucose, it can be followed regularly by your caregiver. That way he/she can make sure your hyperglycemia does not continue to get worse or progress to  diabetes. °Steroids °· Steroids are medications that act on the infection fighting system (immune system) to block inflammation or infection. One side effect can be a rise in blood glucose. Most people can produce enough extra insulin to allow for this rise, but for those who cannot, steroids make blood glucose levels go even higher. It is not unusual for steroid treatments to "uncover" diabetes that is developing. It is not always possible to determine if the hyperglycemia will go away after the steroids are stopped. A special blood test called an A1c is sometimes done to determine if your blood glucose was elevated before the steroids were started. °SYMPTOMS °· Thirsty. °· Frequent urination. °· Dry mouth. °· Blurred vision. °· Tired or fatigue. °· Weakness. °· Sleepy. °· Tingling in feet or leg. °DIAGNOSIS  °Diagnosis is made by monitoring blood glucose in one or all of the following ways: °· A1c test. This is a chemical found in your blood. °· Fingerstick blood glucose monitoring. °· Laboratory results. °TREATMENT  °First, knowing the cause of the hyperglycemia is important before the hyperglycemia can be treated. Treatment may include, but is not be limited to: °· Education. °· Change or adjustment in medications. °· Change or adjustment in meal plan. °· Treatment for an illness, infection, etc. °· More frequent blood glucose monitoring. °· Change in exercise plan. °· Decreasing or stopping steroids. °· Lifestyle changes. °HOME CARE INSTRUCTIONS  °· Test your blood glucose as directed. °· Exercise regularly. Your caregiver will give you instructions about exercise. Pre-diabetes or diabetes which comes on with stress is helped by exercising. °· Eat wholesome,   balanced meals. Eat often and at regular, fixed times. Your caregiver or nutritionist will give you a meal plan to guide your sugar intake. °· Being at an ideal weight is important. If needed, losing as little as 10 to 15 pounds may help improve blood  glucose levels. °SEEK MEDICAL CARE IF:  °· You have questions about medicine, activity, or diet. °· You continue to have symptoms (problems such as increased thirst, urination, or weight gain). °SEEK IMMEDIATE MEDICAL CARE IF:  °· You are vomiting or have diarrhea. °· Your breath smells fruity. °· You are breathing faster or slower. °· You are very sleepy or incoherent. °· You have numbness, tingling, or pain in your feet or hands. °· You have chest pain. °· Your symptoms get worse even though you have been following your caregiver's orders. °· If you have any other questions or concerns. °  °This information is not intended to replace advice given to you by your health care provider. Make sure you discuss any questions you have with your health care provider. °  °Document Released: 11/06/2000 Document Revised: 08/05/2011 Document Reviewed: 01/17/2015 °Elsevier Interactive Patient Education ©2016 [employer] ° °

## 2020-02-26 ENCOUNTER — Other Ambulatory Visit: Payer: Self-pay

## 2020-02-26 DIAGNOSIS — E1151 Type 2 diabetes mellitus with diabetic peripheral angiopathy without gangrene: Secondary | ICD-10-CM | POA: Insufficient documentation

## 2020-02-26 DIAGNOSIS — E860 Dehydration: Secondary | ICD-10-CM | POA: Insufficient documentation

## 2020-02-26 DIAGNOSIS — E1140 Type 2 diabetes mellitus with diabetic neuropathy, unspecified: Secondary | ICD-10-CM | POA: Insufficient documentation

## 2020-02-26 DIAGNOSIS — Z20822 Contact with and (suspected) exposure to covid-19: Secondary | ICD-10-CM | POA: Insufficient documentation

## 2020-02-26 DIAGNOSIS — Z87891 Personal history of nicotine dependence: Secondary | ICD-10-CM | POA: Insufficient documentation

## 2020-02-26 DIAGNOSIS — N1832 Chronic kidney disease, stage 3b: Secondary | ICD-10-CM | POA: Diagnosis not present

## 2020-02-26 DIAGNOSIS — E11319 Type 2 diabetes mellitus with unspecified diabetic retinopathy without macular edema: Secondary | ICD-10-CM | POA: Diagnosis not present

## 2020-02-26 DIAGNOSIS — R112 Nausea with vomiting, unspecified: Secondary | ICD-10-CM | POA: Insufficient documentation

## 2020-02-26 DIAGNOSIS — R197 Diarrhea, unspecified: Secondary | ICD-10-CM | POA: Insufficient documentation

## 2020-02-26 DIAGNOSIS — Z79899 Other long term (current) drug therapy: Secondary | ICD-10-CM | POA: Diagnosis not present

## 2020-02-26 DIAGNOSIS — Z794 Long term (current) use of insulin: Secondary | ICD-10-CM | POA: Diagnosis not present

## 2020-02-26 DIAGNOSIS — E1122 Type 2 diabetes mellitus with diabetic chronic kidney disease: Secondary | ICD-10-CM | POA: Diagnosis not present

## 2020-02-26 DIAGNOSIS — Z7982 Long term (current) use of aspirin: Secondary | ICD-10-CM | POA: Insufficient documentation

## 2020-02-26 DIAGNOSIS — I129 Hypertensive chronic kidney disease with stage 1 through stage 4 chronic kidney disease, or unspecified chronic kidney disease: Secondary | ICD-10-CM | POA: Diagnosis not present

## 2020-02-26 DIAGNOSIS — R1030 Lower abdominal pain, unspecified: Secondary | ICD-10-CM | POA: Diagnosis not present

## 2020-02-26 LAB — CBC
HCT: 32.1 % — ABNORMAL LOW (ref 36.0–46.0)
Hemoglobin: 11.1 g/dL — ABNORMAL LOW (ref 12.0–15.0)
MCH: 28.7 pg (ref 26.0–34.0)
MCHC: 34.6 g/dL (ref 30.0–36.0)
MCV: 82.9 fL (ref 80.0–100.0)
Platelets: 440 10*3/uL — ABNORMAL HIGH (ref 150–400)
RBC: 3.87 MIL/uL (ref 3.87–5.11)
RDW: 13 % (ref 11.5–15.5)
WBC: 12.8 10*3/uL — ABNORMAL HIGH (ref 4.0–10.5)
nRBC: 0 % (ref 0.0–0.2)

## 2020-02-26 MED ORDER — ONDANSETRON 4 MG PO TBDP
ORAL_TABLET | ORAL | Status: AC
  Administered 2020-02-26: 4 mg via ORAL
  Filled 2020-02-26: qty 1

## 2020-02-26 MED ORDER — ONDANSETRON 4 MG PO TBDP: 4.0000 mg | ORAL_TABLET | Freq: Once | ORAL | Status: AC | PRN

## 2020-02-26 NOTE — ED Triage Notes (Signed)
Patient coming from [facility]. Patient reportedly had an ensure and began to vomit at breakfast today. Patient had 3 emeses today. Patient bradycardic for EMS, HR 40-48, hypertensive at 194/58; other vitals stable.

## 2020-02-26 NOTE — ED Triage Notes (Signed)
Patient denies pain, dizziness, light-headedness. Patient c/o continued nausea.

## 2020-02-27 ENCOUNTER — Emergency Department: Payer: Medicare (Managed Care)

## 2020-02-27 ENCOUNTER — Emergency Department
Admission: EM | Admit: 2020-02-27 | Discharge: 2020-02-27 | Disposition: A | Discharge status: Home / Self Care | Payer: Medicare (Managed Care) | Attending: Emergency Medicine | Admitting: Emergency Medicine

## 2020-02-27 DIAGNOSIS — R197 Diarrhea, unspecified: Secondary | ICD-10-CM

## 2020-02-27 DIAGNOSIS — E860 Dehydration: Secondary | ICD-10-CM

## 2020-02-27 DIAGNOSIS — N179 Acute kidney failure, unspecified: Secondary | ICD-10-CM

## 2020-02-27 DIAGNOSIS — R1030 Lower abdominal pain, unspecified: Secondary | ICD-10-CM

## 2020-02-27 LAB — COMPREHENSIVE METABOLIC PANEL
ALT: 18 U/L (ref 0–44)
AST: 24 U/L (ref 15–41)
Albumin: 3.7 g/dL (ref 3.5–5.0)
Alkaline Phosphatase: 125 U/L (ref 38–126)
Anion gap: 11 (ref 5–15)
BUN: 29 mg/dL — ABNORMAL HIGH (ref 8–23)
CO2: 25 mmol/L (ref 22–32)
Calcium: 9.4 mg/dL (ref 8.9–10.3)
Chloride: 105 mmol/L (ref 98–111)
Creatinine, Ser: 1.62 mg/dL — ABNORMAL HIGH (ref 0.44–1.00)
GFR calc Af Amer: 38 mL/min — ABNORMAL LOW (ref >60)
GFR calc non Af Amer: 33 mL/min — ABNORMAL LOW (ref >60)
Glucose, Bld: 128 mg/dL — ABNORMAL HIGH (ref 70–99)
Potassium: 4.6 mmol/L (ref 3.5–5.1)
Sodium: 141 mmol/L (ref 135–145)
Total Bilirubin: 0.4 mg/dL (ref 0.3–1.2)
Total Protein: 8.6 g/dL — ABNORMAL HIGH (ref 6.5–8.1)

## 2020-02-27 LAB — URINALYSIS, COMPLETE (UACMP) WITH MICROSCOPIC
Bacteria, UA: NONE SEEN
Bilirubin Urine: NEGATIVE
Glucose, UA: NEGATIVE mg/dL
Hgb urine dipstick: NEGATIVE
Ketones, ur: NEGATIVE mg/dL
Leukocytes,Ua: NEGATIVE
Nitrite: NEGATIVE
Protein, ur: >=300 mg/dL — AB
Specific Gravity, Urine: 1.013 (ref 1.005–1.030)
pH: 6 (ref 5.0–8.0)

## 2020-02-27 LAB — TROPONIN I (HIGH SENSITIVITY): Troponin I (High Sensitivity): 15 ng/L (ref <18)

## 2020-02-27 LAB — RESPIRATORY PANEL BY RT PCR (FLU A&B, COVID)
Influenza A by PCR: NEGATIVE
Influenza B by PCR: NEGATIVE
SARS Coronavirus 2 by RT PCR: NEGATIVE

## 2020-02-27 LAB — LIPASE, BLOOD: Lipase: 99 U/L — ABNORMAL HIGH (ref 11–51)

## 2020-02-27 MED ORDER — IOHEXOL 9 MG/ML PO SOLN: 500.0000 mL | ORAL | Status: AC

## 2020-02-27 MED ORDER — ONDANSETRON HCL 4 MG/2ML IJ SOLN
4.0000 mg | Freq: Once | INTRAMUSCULAR | Status: AC
  Administered 2020-02-27: 4 mg via INTRAVENOUS
  Filled 2020-02-27: qty 2

## 2020-02-27 MED ORDER — ONDANSETRON 4 MG PO TBDP
4.0000 mg | ORAL_TABLET | Freq: Once | ORAL | Status: AC
  Administered 2020-02-27: 4 mg via ORAL
  Filled 2020-02-27: qty 1

## 2020-02-27 MED ORDER — SODIUM CHLORIDE 0.9 % IV BOLUS
1000.0000 mL | Freq: Once | INTRAVENOUS | Status: AC
  Administered 2020-02-27: 1000 mL via INTRAVENOUS

## 2020-02-27 NOTE — ED Notes (Signed)
Responded to call for help from patient room. Pt had vomited on her shirt and blankets after starting to drink her contrast. Pt cleaned and placed in hospital gown, given fresh blankets. Dr Dolores Frame notified.

## 2020-02-27 NOTE — ED Notes (Signed)
Patient given coke to trial her N/V

## 2020-02-27 NOTE — ED Provider Notes (Signed)
[facility] Emergency Department Provider Note   ____________________________________________   First MD Initiated Contact with Patient 02/27/20 0602     (approximate)  I have reviewed the triage vital signs and the nursing notes.   HISTORY  Chief Complaint Emesis  Level of V caveat: Limited by dementia  HPI [facility] is a 64 y.o. female who presents to the ED from [facility] memory care for lower abdominal pain, nausea/vomiting/diarrhea.  Onset of symptoms yesterday.  Reports 3 episodes of emesis and 3 episodes of diarrhea.  Complains of lower abdominal pain.  Denies fever, cough, chest pain, shortness of breath.     Past Medical History:  Diagnosis Date  . Cerebellar ataxia (HCC)   . Chronic kidney disease (CKD), stage II (mild)   . Constipation   . Dementia with behavioral disturbance (HCC)   . Diabetes mellitus without complication (HCC)   . Diabetic retinopathy (HCC)   . Hypertension   . Peripheral vascular disease (HCC)   . Renal disorder     There are no problems to display for this patient.   History reviewed. No pertinent surgical history.  Prior to Admission medications   Medication Sig Start Date End Date Taking? Authorizing Provider  aspirin 81 MG tablet Take 81 mg by mouth at bedtime. Take with a snake    [provider]  atorvastatin (LIPITOR) 40 MG tablet Take 40 mg by mouth at bedtime.    [provider]  cloNIDine (CATAPRES - DOSED IN MG/24 HR) 0.1 mg/24hr patch Place 0.1 mg onto the skin once a week. Apply one patch (0.1mg ) weekly on Friday for Blood Pressure.    [provider]  clotrimazole (LOTRIMIN) 1 % cream Apply 1 application topically 2 (two) times daily. Apply to feet for fungal infection    [provider]  insulin glargine (LANTUS) 100 UNIT/ML injection Inject 30 Units into the skin at bedtime.    [provider]  Polyethyl Glycol-Propyl Glycol (SYSTANE) 0.4-0.3  % SOLN Apply 1 drop to eye every 4 (four) hours as needed (For dryness).    [provider]  senna-docusate (SENOKOT-S) 8.6-50 MG tablet Take 1 tablet by mouth daily.    [provider]    Allergies Patient has no known allergies.  No family history on file.  Social History Social History   Tobacco Use  . Smoking status: Former Games developer  . Smokeless tobacco: Never Used  Substance Use Topics  . Alcohol use: No  . Drug use: Not on file    Review of Systems  Constitutional: No fever/chills Eyes: No visual changes. ENT: No sore throat. Cardiovascular: Denies chest pain. Respiratory: Denies shortness of breath. Gastrointestinal: Positive for abdominal pain, nausea, vomiting and diarrhea.  No constipation. Genitourinary: Negative for dysuria. Musculoskeletal: Negative for back pain. Skin: Negative for rash. Neurological: Negative for headaches, focal weakness or numbness.   ____________________________________________   PHYSICAL EXAM:  VITAL SIGNS: ED Triage Vitals  Enc Vitals Group     BP 02/26/20 2325 (!) 187/56     Pulse Rate 02/26/20 2325 (!) 45     Resp 02/26/20 2325 18     Temp 02/26/20 2325 98.9 F (37.2 C)     Temp src --      SpO2 02/26/20 2325 97 %     Weight 02/26/20 2326 143 lb 15.4 oz (65.3 kg)     Height 02/26/20 2326 5\' 3"  (1.6 m)     Head Circumference --  Peak Flow --      Pain Score 02/26/20 2326 0     Pain Loc --      Pain Edu? --      Excl. in GC? --     Constitutional: Alert and oriented.  Appears older than stated age and in no acute distress. Eyes: Conjunctivae are normal. PERRL. EOMI. Head: Atraumatic. Nose: No congestion/rhinnorhea. Mouth/Throat: Mucous membranes are mildly dry.   Neck: No stridor.   Cardiovascular: Normal rate, regular rhythm. Grossly normal heart sounds.  Good peripheral circulation. Respiratory: Normal respiratory effort.  No retractions. Lungs CTAB. Gastrointestinal: Soft and mildly tender  to palpation lower abdomen without rebound or guarding. No distention. No abdominal bruits. No CVA tenderness. Musculoskeletal: No lower extremity tenderness nor edema.  No joint effusions. Neurologic:  Normal speech and language. No gross focal neurologic deficits are appreciated.  Skin:  Skin is warm, dry and intact. No rash noted. Psychiatric: Mood and affect are normal. Speech and behavior are normal.  ____________________________________________   LABS (all labs ordered are listed, but only abnormal results are displayed)  Labs Reviewed  LIPASE, BLOOD - Abnormal; Notable for the following components:      Result Value   Lipase 99 (*)    All other components within normal limits  COMPREHENSIVE METABOLIC PANEL - Abnormal; Notable for the following components:   Glucose, Bld 128 (*)    BUN 29 (*)    Creatinine, Ser 1.62 (*)    Total Protein 8.6 (*)    GFR calc non Af Amer 33 (*)    GFR calc Af Amer 38 (*)    All other components within normal limits  CBC - Abnormal; Notable for the following components:   WBC 12.8 (*)    Hemoglobin 11.1 (*)    HCT 32.1 (*)    Platelets 440 (*)    All other components within normal limits  RESPIRATORY PANEL BY RT PCR (FLU A&B, COVID)  URINALYSIS, COMPLETE (UACMP) WITH MICROSCOPIC  TROPONIN I (HIGH SENSITIVITY)   ____________________________________________  EKG  ED ECG REPORT I, Shahin Knierim J, the attending physician, personally viewed and interpreted this ECG.   Date: 02/27/2020  EKG Time: 2327  Rate: 47  Rhythm: sinus bradycardia  Axis: Normal  Intervals:none  ST&T Change: Nonspecific  ____________________________________________  RADIOLOGY I, Mili Piltz J, personally viewed and evaluated these images (plain radiographs) as part of my medical decision making, as well as reviewing the written report by the radiologist.  ED MD interpretation:  Pending  Official radiology report(s): No results  found.  ____________________________________________   PROCEDURES  Procedure(s) performed (including Critical Care):  Procedures   ____________________________________________   INITIAL IMPRESSION / ASSESSMENT AND PLAN / ED COURSE  As part of my medical decision making, I reviewed the following data within the electronic MEDICAL RECORD NUMBER Nursing notes reviewed and incorporated, Labs reviewed, EKG interpreted, Old chart reviewed, Radiograph reviewed and Notes from prior ED visits     64 year old female from memory care unit presenting with lower abdominal pain, nausea/vomiting/diarrhea. Differential diagnosis includes, but is not limited to, ovarian cyst, ovarian torsion, acute appendicitis, diverticulitis, urinary tract infection/pyelonephritis, endometriosis, bowel obstruction, colitis, renal colic, gastroenteritis, hernia, fibroids, endometriosis, etc.  Laboratory results demonstrate mild leukocytosis, mild AKI, mild elevation in lipase.  Will check troponin, UA, Covid swab.  Will obtain CT abdomen/pelvis to evaluate etiology of patient's symptoms.  Of note, nursing ordered left foot x-ray which patient refused for noted deformity.  After removing patient saw, it is  noted she has a Coban dressing on.  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  No heel ulcer noted.  DuoDerm dressing applied by nursing.  X-ray not warranted; will cancel.   Clinical Course as of Feb 26 654  Wynelle Link Feb 27, 2020  0109 Care transferred to Dr. Vicente Males at change of shift pending labs, UA and CT scan.    [JS]    Clinical Course User Index [JS] Irean Hong, MD     ____________________________________________   FINAL CLINICAL IMPRESSION(S) / ED DIAGNOSES  Final diagnoses:  Nausea vomiting and diarrhea  Lower abdominal pain  AKI (acute kidney injury) (HCC)  Dehydration     ED Discharge Orders    None      *Please note:  Joann E Wherley was evaluated in Emergency  Department on 02/27/2020 for the symptoms described in the history of present illness. She was evaluated in the context of the global COVID-19 pandemic, which necessitated consideration that the patient might be at risk for infection with the SARS-CoV-2 virus that causes COVID-19. Institutional protocols and algorithms that pertain to the evaluation of patients at risk for COVID-19 are in a state of rapid change based on information released by regulatory bodies including the CDC and federal and state organizations. These policies and algorithms were followed during the patient's care in the ED.  Some ED evaluations and interventions may be delayed as a result of limited staffing during and the pandemic.*   Note:  This document was prepared using Dragon voice recognition software and may include unintentional dictation errors.   Irean Hong, MD 02/27/20 [phone]

## 2020-02-27 NOTE — ED Notes (Addendum)
Patient reports she cannot drink anymore of contrast. Per Elijah Birk RN, MD is ok with continuing with scan if not able to drink anymore due to N/V. CT made aware and will come get patient for scan

## 2020-02-27 NOTE — ED Notes (Signed)
Pt able to tolerate graham cracker and sips of coke

## 2020-02-27 NOTE — ED Notes (Signed)
Respiratory walked to lab at this time

## 2020-02-27 NOTE — ED Notes (Signed)
Called [facility] for transport to [facility] 1453

## 2020-02-27 NOTE — ED Notes (Signed)
[facility] called for report at [phone]

## 2020-02-27 NOTE — ED Notes (Signed)
Pt not able to sign for discharge papers due to cognitive status. Pt discharge instructions discussed with staff at memory care. Pt in NAD at time of departure. VSS.

## 2020-03-16 ENCOUNTER — Other Ambulatory Visit: Payer: Self-pay | Admitting: Family Medicine

## 2020-03-16 ENCOUNTER — Other Ambulatory Visit: Payer: Self-pay | Admitting: Internal Medicine

## 2020-03-16 DIAGNOSIS — R1084 Generalized abdominal pain: Secondary | ICD-10-CM

## 2020-03-29 ENCOUNTER — Other Ambulatory Visit: Payer: Self-pay

## 2020-03-29 ENCOUNTER — Ambulatory Visit
Admission: RE | Admit: 2020-03-29 | Discharge: 2020-03-29 | Disposition: A | Discharge status: Home / Self Care | Payer: Medicare (Managed Care) | Source: Ambulatory Visit | Attending: Family Medicine | Admitting: Family Medicine

## 2020-03-29 DIAGNOSIS — R1084 Generalized abdominal pain: Secondary | ICD-10-CM | POA: Insufficient documentation

## 2021-07-30 IMAGING — CT CT ABD-PELV W/O CM
2 of 5 series · 16 of 46 positions shown, 18 images · non-contrast
Comparison: None.

CLINICAL DATA: Nausea, vomiting.

EXAM:
CT ABDOMEN AND PELVIS WITHOUT CONTRAST
TECHNIQUE: Multidetector CT imaging of the abdomen and pelvis was performed
following the standard protocol without IV contrast.

[Series 2: routine abd/pel wo · axial · 0.73mm/px · z∈[-389,+36]mm · 13 of 95 slices shown, 15 images]
[im 5/95  soft-tissue]
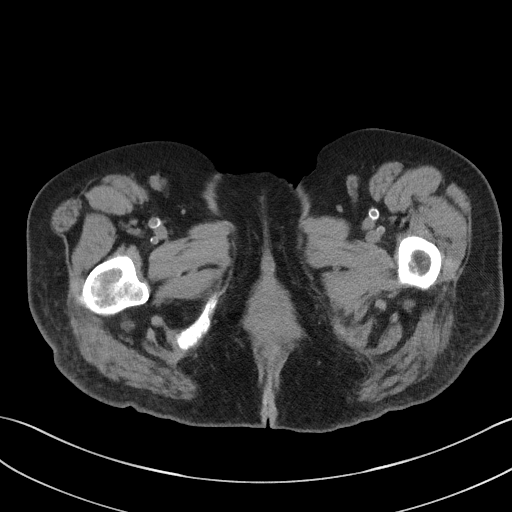
[im 5/95  bone]
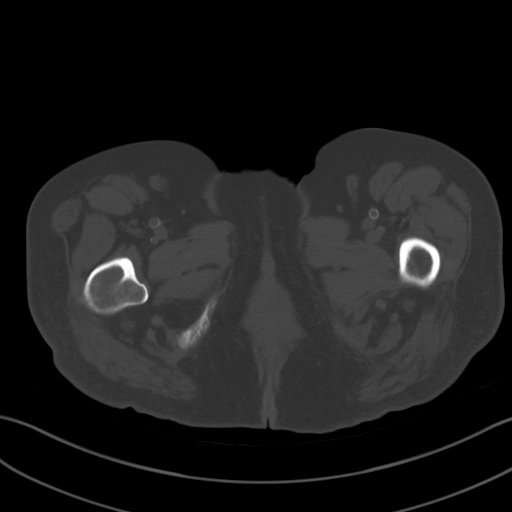
[im 14/95  soft-tissue]
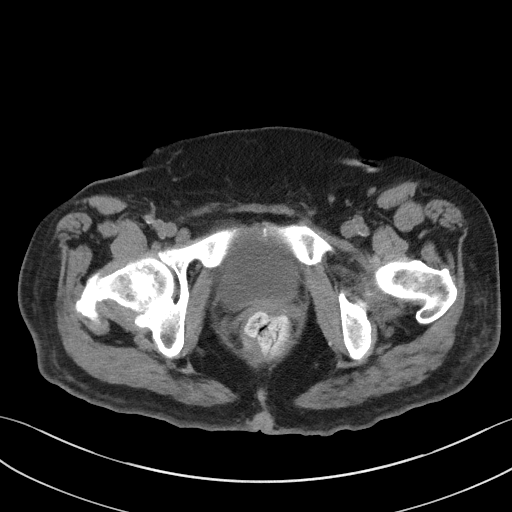
[im 18/95  soft-tissue]
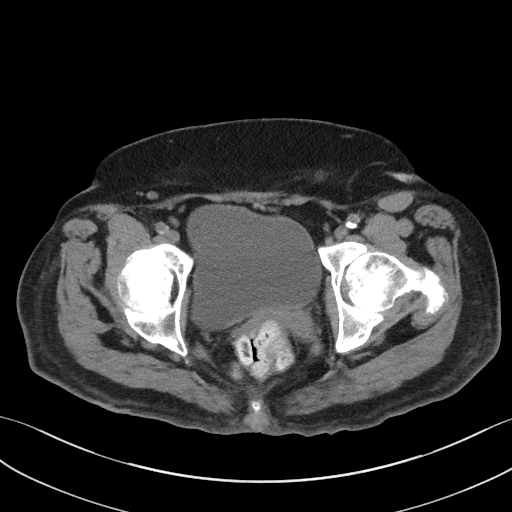
[im 27/95  soft-tissue]
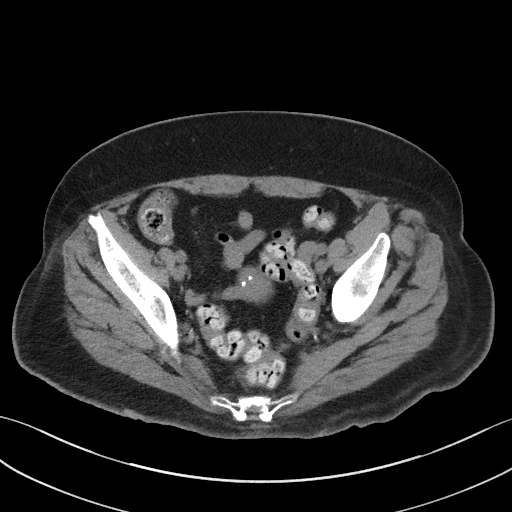
[im 32/95  soft-tissue]
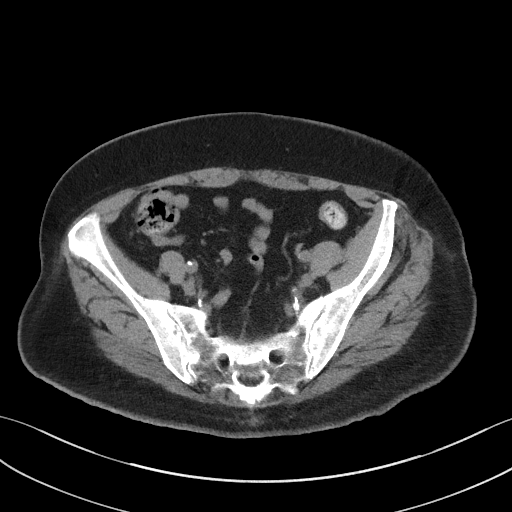
[im 41/95  soft-tissue]
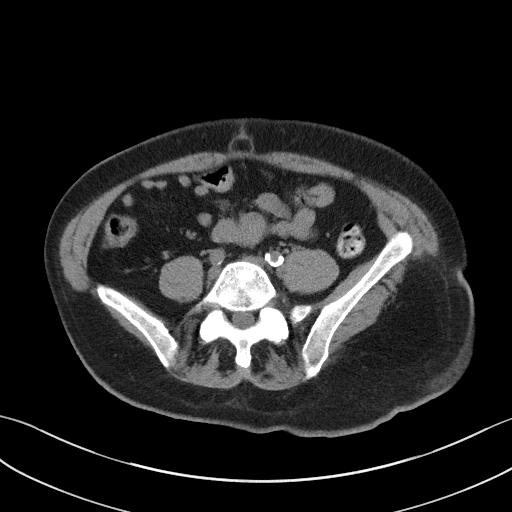
[im 50/95  soft-tissue]
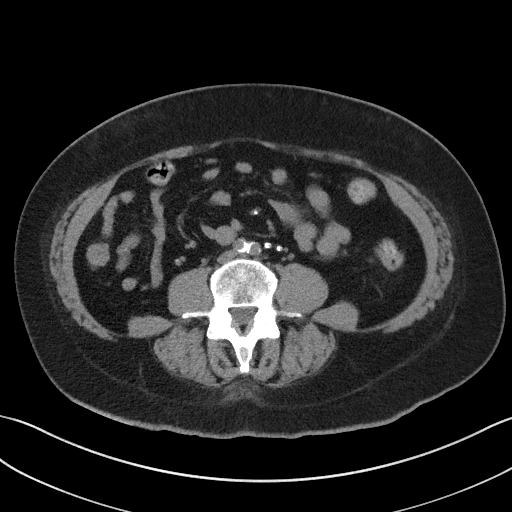
[im 54/95  soft-tissue]
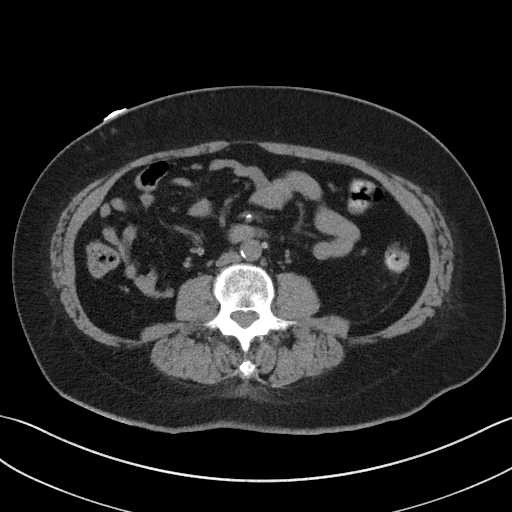
[im 63/95  soft-tissue]
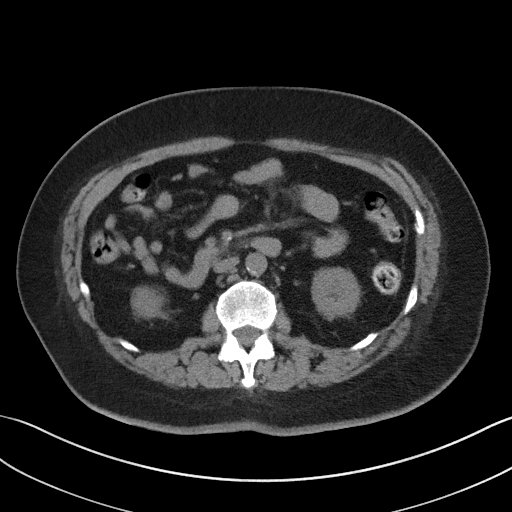
[im 63/95  bone]
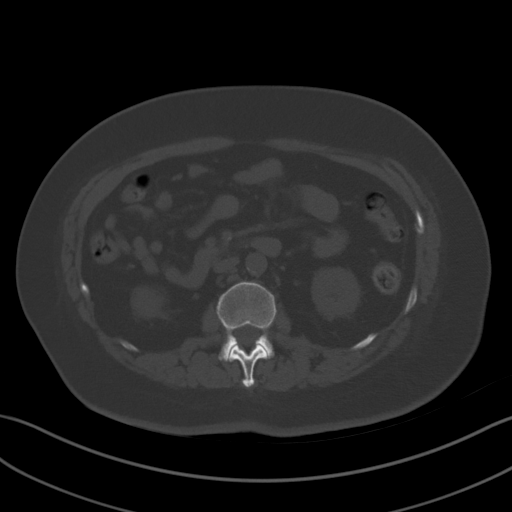
[im 68/95  soft-tissue]
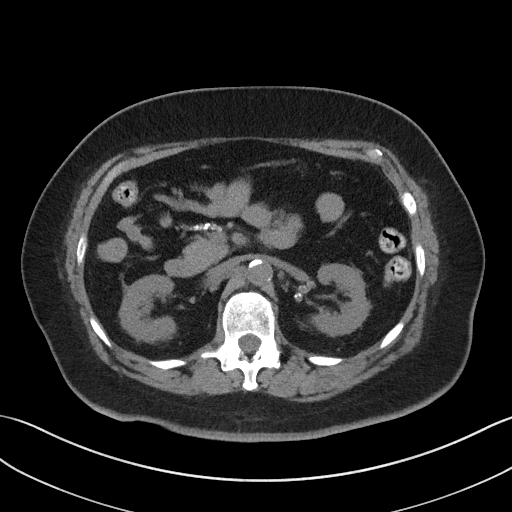
[im 77/95  soft-tissue]
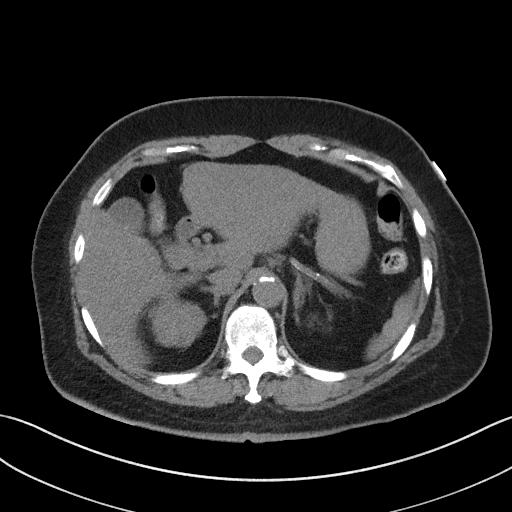
[im 81/95  soft-tissue]
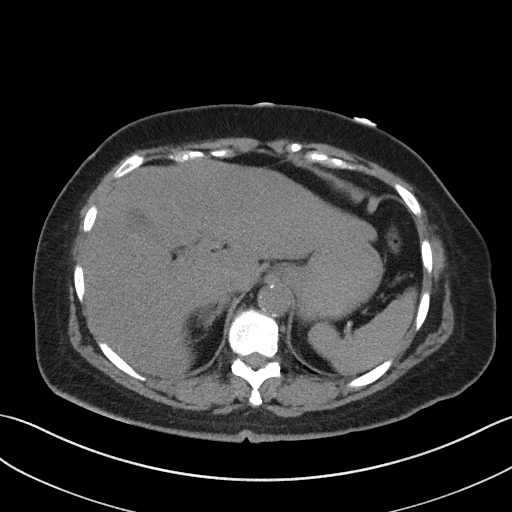
[im 90/95  soft-tissue]
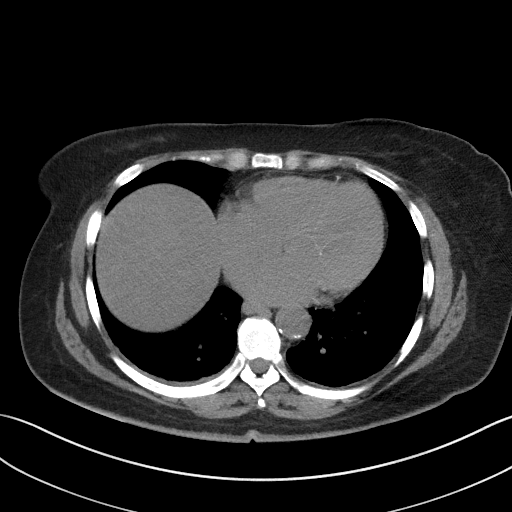

[Series 8: coronal st · coronal · 0.74mm/px · 3 of 89 slices shown]
[im 30/89  soft-tissue]
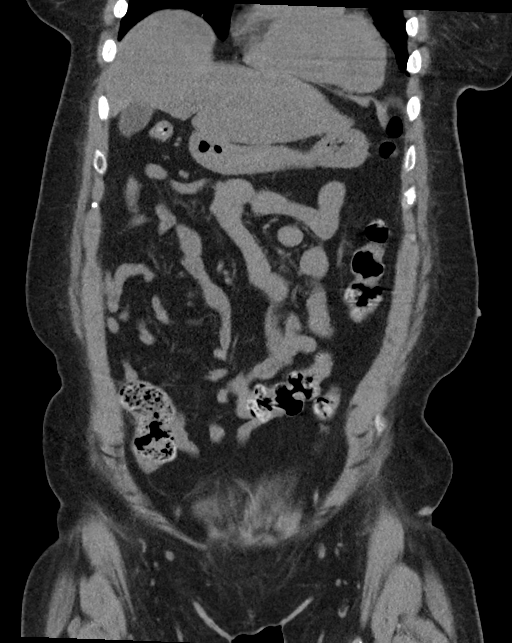
[im 40/89  soft-tissue]
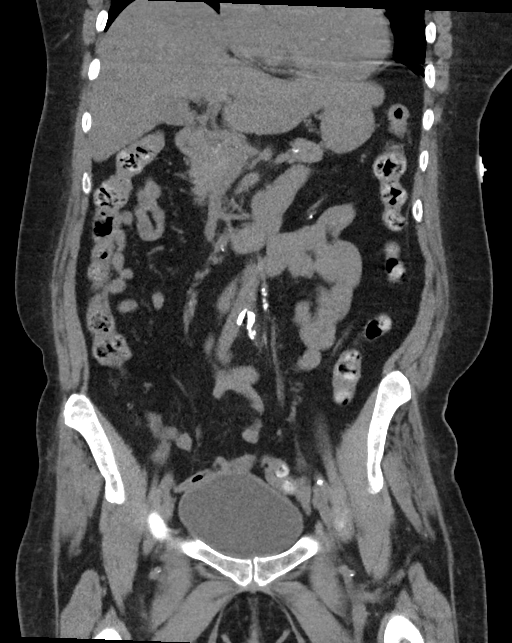
[im 49/89  soft-tissue]
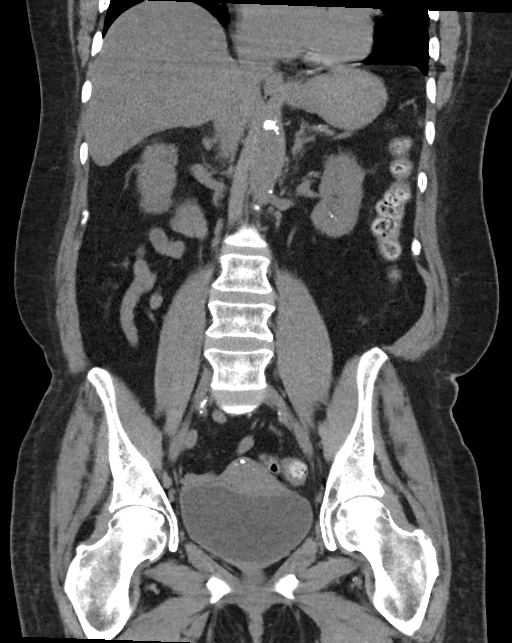

[16 of 46 positions shown; findings below may reference images not displayed]

FINDINGS: Lower chest: No acute abnormality.

Hepatobiliary: No focal liver abnormality is seen. No gallstones,
gallbladder wall thickening, or biliary dilatation.

Pancreas: Unremarkable. No pancreatic ductal dilatation or
surrounding inflammatory changes.

Spleen: Normal in size without focal abnormality.

Adrenals/Urinary Tract: Adrenal glands are unremarkable. Kidneys are
normal, without renal calculi, focal lesion, or hydronephrosis.
Bladder is unremarkable.

Stomach/Bowel: The stomach appears normal. There is no evidence of
bowel obstruction or inflammation. The appendix is not clearly
visualized.

Vascular/Lymphatic: Aortic atherosclerosis. No enlarged abdominal or
pelvic lymph nodes.

Reproductive: Uterus and bilateral adnexa are unremarkable.

Other: No abdominal wall hernia or abnormality. No abdominopelvic
ascites.

Musculoskeletal: No acute or significant osseous findings.
IMPRESSION: Aortic atherosclerosis. No acute abnormality seen in the abdomen or
pelvis.

Aortic Atherosclerosis ([id number]).
